# Patient Record
Sex: Female | Born: 1990 | Race: White | Hispanic: Yes | Marital: Married | Smoking: Never smoker
Health system: Southern US, Community
[De-identification: ages and names within clinical notes are randomized; demographics above are authoritative.]

## PROBLEM LIST (undated history)

## (undated) ENCOUNTER — Inpatient Hospital Stay (HOSPITAL_COMMUNITY): Payer: Self-pay

## (undated) DIAGNOSIS — Z789 Other specified health status: Secondary | ICD-10-CM

## (undated) HISTORY — PX: NO PAST SURGERIES: SHX2092

---

## 2009-04-14 ENCOUNTER — Ambulatory Visit (HOSPITAL_COMMUNITY): Admission: RE | Admit: 2009-04-14 | Discharge: 2009-04-14 | Payer: Self-pay | Admitting: Family Medicine

## 2009-06-05 ENCOUNTER — Ambulatory Visit (HOSPITAL_COMMUNITY): Admission: RE | Admit: 2009-06-05 | Discharge: 2009-06-05 | Payer: Self-pay | Admitting: Obstetrics & Gynecology

## 2009-08-13 ENCOUNTER — Ambulatory Visit (HOSPITAL_COMMUNITY): Admission: RE | Admit: 2009-08-13 | Discharge: 2009-08-13 | Payer: Self-pay | Admitting: Obstetrics & Gynecology

## 2009-08-19 ENCOUNTER — Inpatient Hospital Stay (HOSPITAL_COMMUNITY): Admission: AD | Admit: 2009-08-19 | Discharge: 2009-08-23 | Payer: Self-pay | Admitting: Family Medicine

## 2009-08-19 ENCOUNTER — Ambulatory Visit: Payer: Self-pay | Admitting: Obstetrics & Gynecology

## 2010-03-14 ENCOUNTER — Encounter: Payer: Self-pay | Admitting: Family Medicine

## 2010-05-09 LAB — CBC
HCT: 38.4 % (ref 36.0–46.0)
Hemoglobin: 13.4 g/dL (ref 12.0–15.0)
MCV: 87.4 fL (ref 78.0–100.0)
Platelets: 235 10*3/uL (ref 150–400)
RDW: 13.8 % (ref 11.5–15.5)
WBC: 13.9 10*3/uL — ABNORMAL HIGH (ref 4.0–10.5)

## 2015-02-18 LAB — OB RESULTS CONSOLE GC/CHLAMYDIA
Chlamydia: NEGATIVE
Gonorrhea: NEGATIVE

## 2015-02-18 LAB — OB RESULTS CONSOLE RUBELLA ANTIBODY, IGM: RUBELLA: IMMUNE

## 2015-02-18 LAB — OB RESULTS CONSOLE ABO/RH: RH TYPE: POSITIVE

## 2015-02-18 LAB — OB RESULTS CONSOLE VARICELLA ZOSTER ANTIBODY, IGG: Varicella: IMMUNE

## 2015-02-18 LAB — OB RESULTS CONSOLE HIV ANTIBODY (ROUTINE TESTING): HIV: NONREACTIVE

## 2015-02-18 LAB — OB RESULTS CONSOLE ANTIBODY SCREEN: Antibody Screen: NEGATIVE

## 2015-02-18 LAB — OB RESULTS CONSOLE HEPATITIS B SURFACE ANTIGEN: HEP B S AG: NEGATIVE

## 2015-02-18 LAB — OB RESULTS CONSOLE RPR: RPR: NONREACTIVE

## 2015-02-22 NOTE — L&D Delivery Note (Signed)
Delivery Note At 9:09 AM a viable and healthy female was delivered via Vaginal, Spontaneous Delivery (Presentation: ; Occiput Anterior, LOT w/ compound arm).  APGAR: 9, 9; weight  pending.   Placenta status: intact, .  Cord: 3 vessels with the following complications: None.  Cord pH: Not sent.  Anesthesia: local  Episiotomy: None Lacerations: 1st degree;Perineal Suture Repair: 4.0 vicryl Est. Blood Loss (mL): 100   Mom to postpartum.  Baby to Couplet care / Skin to Skin.  Olena LeatherwoodKelly M Aguilar 08/10/2015, 9:36 AM  Midwife attestation: I was gloved and present for delivery in its entirety and I agree with the above resident's note.  Donette LarryMelanie Larone Kliethermes, CNM 10:21 AM

## 2015-02-26 ENCOUNTER — Other Ambulatory Visit (HOSPITAL_COMMUNITY): Payer: Self-pay | Admitting: Nurse Practitioner

## 2015-02-26 DIAGNOSIS — Z3689 Encounter for other specified antenatal screening: Secondary | ICD-10-CM

## 2015-03-17 ENCOUNTER — Other Ambulatory Visit (HOSPITAL_COMMUNITY): Payer: Self-pay | Admitting: Nurse Practitioner

## 2015-03-17 ENCOUNTER — Ambulatory Visit (HOSPITAL_COMMUNITY)
Admission: RE | Admit: 2015-03-17 | Discharge: 2015-03-17 | Disposition: A | Discharge status: Home / Self Care | Payer: Self-pay | Source: Ambulatory Visit | Attending: Nurse Practitioner | Admitting: Nurse Practitioner

## 2015-03-17 DIAGNOSIS — Z36 Encounter for antenatal screening of mother: Secondary | ICD-10-CM | POA: Insufficient documentation

## 2015-03-17 DIAGNOSIS — Z3A18 18 weeks gestation of pregnancy: Secondary | ICD-10-CM

## 2015-03-17 DIAGNOSIS — Z3689 Encounter for other specified antenatal screening: Secondary | ICD-10-CM

## 2015-03-17 DIAGNOSIS — Z3A17 17 weeks gestation of pregnancy: Secondary | ICD-10-CM | POA: Insufficient documentation

## 2015-05-30 ENCOUNTER — Emergency Department (HOSPITAL_COMMUNITY)
Admission: EM | Admit: 2015-05-30 | Discharge: 2015-05-30 | Disposition: A | Discharge status: Home / Self Care | Payer: No Typology Code available for payment source | Attending: Emergency Medicine | Admitting: Emergency Medicine

## 2015-05-30 ENCOUNTER — Emergency Department (HOSPITAL_COMMUNITY): Payer: No Typology Code available for payment source

## 2015-05-30 ENCOUNTER — Encounter (HOSPITAL_COMMUNITY): Payer: Self-pay | Admitting: Emergency Medicine

## 2015-05-30 DIAGNOSIS — Z3A30 30 weeks gestation of pregnancy: Secondary | ICD-10-CM | POA: Diagnosis not present

## 2015-05-30 DIAGNOSIS — O9A213 Injury, poisoning and certain other consequences of external causes complicating pregnancy, third trimester: Secondary | ICD-10-CM | POA: Insufficient documentation

## 2015-05-30 DIAGNOSIS — S60221A Contusion of right hand, initial encounter: Secondary | ICD-10-CM | POA: Insufficient documentation

## 2015-05-30 DIAGNOSIS — Z3490 Encounter for supervision of normal pregnancy, unspecified, unspecified trimester: Secondary | ICD-10-CM

## 2015-05-30 DIAGNOSIS — Y92410 Unspecified street and highway as the place of occurrence of the external cause: Secondary | ICD-10-CM | POA: Diagnosis not present

## 2015-05-30 DIAGNOSIS — Y998 Other external cause status: Secondary | ICD-10-CM | POA: Insufficient documentation

## 2015-05-30 DIAGNOSIS — Y9389 Activity, other specified: Secondary | ICD-10-CM | POA: Insufficient documentation

## 2015-05-30 NOTE — ED Notes (Signed)
Family at bedside. 

## 2015-05-30 NOTE — ED Notes (Signed)
Patient denies pain and is resting comfortably.  
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
## 2015-05-30 NOTE — Discharge Instructions (Signed)

## 2015-05-30 NOTE — ED Notes (Signed)
Vital signs stable. 

## 2015-05-30 NOTE — Progress Notes (Signed)
Spoke with interpreter [phone]. Explained to pt that she is to have an ultrsound to look at the baby and the placenta. Also she is to have 4 hours of fetal monitoring.

## 2015-05-30 NOTE — ED Notes (Signed)
Back form US

## 2015-05-30 NOTE — ED Notes (Addendum)
Patient transported to Ultrasound 

## 2015-05-30 NOTE — Progress Notes (Signed)
Radiology here to do xray of rt hand

## 2015-05-30 NOTE — Progress Notes (Signed)
ED staff notified that pt is OB cleared. OB Limited U/S is normal. FHR is a category 1 tracing with ui.

## 2015-05-30 NOTE — Progress Notes (Signed)
Spoke with Dr. Debroah LoopArnold. Pt has ui on EFM. She has been up to empty her bladder and denies feeling lower abd pain or cramping. OB Limited U/S is normal. FHR tracing is a category 1. Okay for pt to be dc'd home as long as she does not feel abd pain or cramping. Pt has been given water to drink.
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
## 2015-07-22 LAB — OB RESULTS CONSOLE GBS: STREP GROUP B AG: NEGATIVE

## 2015-08-10 ENCOUNTER — Inpatient Hospital Stay (HOSPITAL_COMMUNITY)
Admission: AD | Admit: 2015-08-10 | Discharge: 2015-08-11 | DRG: 775 | Disposition: A | Discharge status: Home / Self Care | Payer: Medicaid Other | Source: Ambulatory Visit | Attending: Obstetrics and Gynecology | Admitting: Obstetrics and Gynecology

## 2015-08-10 ENCOUNTER — Encounter (HOSPITAL_COMMUNITY): Payer: Self-pay

## 2015-08-10 DIAGNOSIS — Z3A39 39 weeks gestation of pregnancy: Secondary | ICD-10-CM | POA: Diagnosis not present

## 2015-08-10 DIAGNOSIS — IMO0001 Reserved for inherently not codable concepts without codable children: Secondary | ICD-10-CM

## 2015-08-10 HISTORY — DX: Other specified health status: Z78.9

## 2015-08-10 LAB — TYPE AND SCREEN
ABO/RH(D): O POS
ANTIBODY SCREEN: NEGATIVE

## 2015-08-10 LAB — ABO/RH: ABO/RH(D): O POS

## 2015-08-10 LAB — CBC
HCT: 40.5 % (ref 36.0–46.0)
HEMOGLOBIN: 13.9 g/dL (ref 12.0–15.0)
MCH: 28.5 pg (ref 26.0–34.0)
MCHC: 34.3 g/dL (ref 30.0–36.0)
MCV: 83 fL (ref 78.0–100.0)
Platelets: 232 10*3/uL (ref 150–400)
RBC: 4.88 MIL/uL (ref 3.87–5.11)
RDW: 14.2 % (ref 11.5–15.5)
WBC: 14.1 10*3/uL — ABNORMAL HIGH (ref 4.0–10.5)

## 2015-08-10 LAB — RPR: RPR Ser Ql: NONREACTIVE

## 2015-08-10 MED ORDER — COCONUT OIL OIL: 1.0000 "application " | TOPICAL_OIL | Status: DC | PRN

## 2015-08-10 MED ORDER — SENNOSIDES-DOCUSATE SODIUM 8.6-50 MG PO TABS
2.0000 | ORAL_TABLET | ORAL | Status: DC
  Administered 2015-08-10: 2 via ORAL
  Filled 2015-08-10: qty 2

## 2015-08-10 MED ORDER — DIPHENHYDRAMINE HCL 25 MG PO CAPS: 25.0000 mg | ORAL_CAPSULE | Freq: Four times a day (QID) | ORAL | Status: DC | PRN

## 2015-08-10 MED ORDER — BENZOCAINE-MENTHOL 20-0.5 % EX AERO: 1.0000 "application " | INHALATION_SPRAY | CUTANEOUS | Status: DC | PRN

## 2015-08-10 MED ORDER — ONDANSETRON HCL 4 MG PO TABS: 4.0000 mg | ORAL_TABLET | ORAL | Status: DC | PRN

## 2015-08-10 MED ORDER — ZOLPIDEM TARTRATE 5 MG PO TABS: 5.0000 mg | ORAL_TABLET | Freq: Every evening | ORAL | Status: DC | PRN

## 2015-08-10 MED ORDER — LACTATED RINGERS IV SOLN
INTRAVENOUS | Status: DC
  Administered 2015-08-10: 05:00:00 via INTRAVENOUS

## 2015-08-10 MED ORDER — FLEET ENEMA 7-19 GM/118ML RE ENEM: 1.0000 | ENEMA | RECTAL | Status: DC | PRN

## 2015-08-10 MED ORDER — PRENATAL MULTIVITAMIN CH
1.0000 | ORAL_TABLET | Freq: Every day | ORAL | Status: DC
  Administered 2015-08-11: 1 via ORAL
  Filled 2015-08-10: qty 1

## 2015-08-10 MED ORDER — TETANUS-DIPHTH-ACELL PERTUSSIS 5-2.5-18.5 LF-MCG/0.5 IM SUSP: 0.5000 mL | Freq: Once | INTRAMUSCULAR | Status: DC

## 2015-08-10 MED ORDER — ONDANSETRON HCL 4 MG/2ML IJ SOLN: 4.0000 mg | INTRAMUSCULAR | Status: DC | PRN

## 2015-08-10 MED ORDER — SOD CITRATE-CITRIC ACID 500-334 MG/5ML PO SOLN: 30.0000 mL | ORAL | Status: DC | PRN

## 2015-08-10 MED ORDER — DIBUCAINE 1 % RE OINT: 1.0000 "application " | TOPICAL_OINTMENT | RECTAL | Status: DC | PRN

## 2015-08-10 MED ORDER — OXYTOCIN BOLUS FROM INFUSION
500.0000 mL | INTRAVENOUS | Status: DC
  Administered 2015-08-10: 500 mL via INTRAVENOUS

## 2015-08-10 MED ORDER — SIMETHICONE 80 MG PO CHEW: 80.0000 mg | CHEWABLE_TABLET | ORAL | Status: DC | PRN

## 2015-08-10 MED ORDER — LACTATED RINGERS IV SOLN: 500.0000 mL | INTRAVENOUS | Status: DC | PRN

## 2015-08-10 MED ORDER — ACETAMINOPHEN 325 MG PO TABS: 650.0000 mg | ORAL_TABLET | ORAL | Status: DC | PRN

## 2015-08-10 MED ORDER — OXYCODONE-ACETAMINOPHEN 5-325 MG PO TABS: 1.0000 | ORAL_TABLET | ORAL | Status: DC | PRN

## 2015-08-10 MED ORDER — WITCH HAZEL-GLYCERIN EX PADS: 1.0000 "application " | MEDICATED_PAD | CUTANEOUS | Status: DC | PRN

## 2015-08-10 MED ORDER — IBUPROFEN 600 MG PO TABS
600.0000 mg | ORAL_TABLET | Freq: Four times a day (QID) | ORAL | Status: DC
  Administered 2015-08-10 – 2015-08-11 (×4): 600 mg via ORAL
  Filled 2015-08-10 (×4): qty 1

## 2015-08-10 MED ORDER — FENTANYL CITRATE (PF) 100 MCG/2ML IJ SOLN
100.0000 ug | INTRAMUSCULAR | Status: DC | PRN
  Administered 2015-08-10 (×2): 100 ug via INTRAVENOUS
  Filled 2015-08-10 (×2): qty 2

## 2015-08-10 MED ORDER — OXYTOCIN 40 UNITS IN LACTATED RINGERS INFUSION - SIMPLE MED
2.5000 [IU]/h | INTRAVENOUS | Status: DC
  Filled 2015-08-10: qty 1000

## 2015-08-10 MED ORDER — ONDANSETRON HCL 4 MG/2ML IJ SOLN: 4.0000 mg | Freq: Four times a day (QID) | INTRAMUSCULAR | Status: DC | PRN

## 2015-08-10 MED ORDER — OXYCODONE-ACETAMINOPHEN 5-325 MG PO TABS: 2.0000 | ORAL_TABLET | ORAL | Status: DC | PRN

## 2015-08-10 MED ORDER — LIDOCAINE HCL (PF) 1 % IJ SOLN
30.0000 mL | INTRAMUSCULAR | Status: DC | PRN
  Administered 2015-08-10: 30 mL via SUBCUTANEOUS
  Filled 2015-08-10: qty 30

## 2015-08-10 NOTE — MAU Note (Signed)
Contractions and some bleeding.

## 2015-08-10 NOTE — Lactation Note (Signed)
This note was copied from a baby's chart. Lactation Consultation Note initial visit at 9 hours of age with Spanish interpreter, [place].  Mom [place] formula [place] [facility].  Baby is asleep in bed with mom who reports a recent feeding.  Mom thinks baby needs formula because baby was crying.  LC discussed early feeding cues and reasons baby may cry.  Baby is noted to have stuffy nose with audible respirations.  Baby does not appear to have retractions or be in distress, although baby is having pursed lip breathing.  LC requested RN to bring saline drops, and O2 sat monitor.  LC administered saline drops and advised mom to call Rn for assist if baby needs more. LC placed O2 sat monitor on hand with reading of 96.  LC reported to RN, Roderick PeeValorie Caldwell with concerns of baby's breathing condition.  LC discussed risks of formula and bottle feedings and mom verbalized understanding. LC offered to instruct mom on hand expression and LC expressed several drops easily.  Advised mom to use her EBM prior to formula and discussed option of using spoon for feedings if needed.  Mom noted to have larger nipple on right breast with irregular shape and mom reports difficulty latching to that breast.  LC reviewed cross cradle latch and encouraged mom to call for assist from RN or LC as needed. [facility] resources given and discussed.  Encouraged to feed with early cues on demand.  Early newborn behavior discussed. Mom to call for assist as needed.          Patient Name: Nicole Angelita InglesYesenia Trejo-Orozco ONGEX'BToday's Date: 08/10/2015 Reason for consult: Initial assessment   Maternal Data Has patient been taught Hand Expression?: Yes Does the patient have breastfeeding experience prior to this delivery?: Yes  Feeding    LATCH Score/Interventions                Intervention(s): Breastfeeding basics reviewed;Skin to skin     Lactation Tools Discussed/Used WIC Program: Yes   Consult Status Consult Status: Follow-up Date:  08/11/15 Follow-up type: In-patient    Jannifer RodneyShoptaw, Jana Lynn 08/10/2015, 6:34 PM

## 2015-08-10 NOTE — H&P (Signed)
LABOR AND DELIVERY ADMISSION HISTORY AND PHYSICAL NOTE  Nicole Lang is a 25 y.o. female G2P1001 with IUP at 4053w5d by LMP presenting for bloody show/ active labor. Pt is spanish speaking. Prenatal risk factors: None She reports positive fetal movement. She denies leakage of fluid or vaginal bleeding.  Prenatal History/Complications:  Past Medical History: History reviewed. No pertinent past medical history.  Past Surgical History: History reviewed. No pertinent past surgical history.  Obstetrical History: OB History    Gravida Para Term Preterm AB TAB SAB Ectopic Multiple Living   2 1 1       1       Social History: Social History   Social History  . Marital Status: Married    Spouse Name: N/A  . Number of Children: N/A  . Years of Education: N/A   Social History Main Topics  . Smoking status: Never Smoker   . Smokeless tobacco: None  . Alcohol Use: No  . Drug Use: None  . Sexual Activity: Not Asked   Other Topics Concern  . None   Social History Narrative    Family History: History reviewed. No pertinent family history.  Allergies: No Known Allergies  No prescriptions prior to admission     Review of Systems   All systems reviewed and negative except as stated in HPI  Blood pressure 127/72, pulse 72, temperature 98.4 F (36.9 C), temperature source Oral, resp. rate 16, height 5' 0.5" (1.537 m), weight 180 lb (81.647 kg), SpO2 100 %. General appearance: alert, cooperative and appears stated age Lungs: clear to auscultation bilaterally Heart: regular rate and rhythm Abdomen: soft, non-tender; bowel sounds normal Extremities: No calf swelling or tenderness Presentation: cephalic Fetal monitoring: Baseline 140, mod variability, +acels, no decels Uterine activity: Ctx q2-703min     Prenatal labs: ABO, Rh:  O positive Antibody:  Negative Rubella: Immune RPR:   Negative HBsAg:   Negative HIV:   Negative GBS:   Negative 1 hr Glucola:  129 Genetic screening:  Normal Anatomy US: 05/30/15-cephalic, anterior placenta, AFI wnl, BPD 7.6cm [redacted]W[redacted]D, grossly normal  Prenatal Transfer Tool  Maternal Diabetes: No Genetic Screening: Normal Maternal Ultrasounds/Referrals: Normal Fetal Ultrasounds or other Referrals:  None Maternal Substance Abuse:  No Significant Maternal Medications:  None Significant Maternal Lab Results: None  No results found for this or any previous visit (from the past 24 hour(s)).  There are no active problems to display for this patient.   Assessment: Nicole Lang is a 25 y.o. G2P1001 at 7553w5d here for SOL.  #Labor: Active #Pain: Fentanyl,NO, does not want epidural at this time #FWB: Cat-1 #ID:  GBS negative #MOF: Breast #MOC:POP #Circ:  Female, not desired  Olena LeatherwoodKelly M Elias Dennington 08/10/2015, 4:58 AM

## 2015-08-10 NOTE — Progress Notes (Signed)
I assisted RN Dyke MaesJana from Lactation with some instructions also I assisted Elige RadonRn Valerie with some admission questions, by Orlan LeavensViria Alvarez Spanish Interpreter.

## 2015-08-11 LAB — CBC
HEMATOCRIT: 37.1 % (ref 36.0–46.0)
Hemoglobin: 12.3 g/dL (ref 12.0–15.0)
MCH: 28 pg (ref 26.0–34.0)
MCHC: 33.2 g/dL (ref 30.0–36.0)
MCV: 84.5 fL (ref 78.0–100.0)
Platelets: 200 10*3/uL (ref 150–400)
RBC: 4.39 MIL/uL (ref 3.87–5.11)
RDW: 14.6 % (ref 11.5–15.5)
WBC: 12.6 10*3/uL — ABNORMAL HIGH (ref 4.0–10.5)

## 2015-08-11 MED ORDER — IBUPROFEN 600 MG PO TABS: 600.0000 mg | ORAL_TABLET | Freq: Four times a day (QID) | ORAL | Status: AC

## 2015-08-11 MED ORDER — ACETAMINOPHEN 325 MG PO TABS: 650.0000 mg | ORAL_TABLET | ORAL | Status: AC | PRN

## 2015-08-11 NOTE — Discharge Instructions (Signed)

## 2015-08-11 NOTE — Discharge Summary (Signed)
OB Discharge Summary     Patient Name: Nicole Lang DOB: 10-08-1990 MRN: [id number]  Date of admission: 08/10/2015 Delivering MD: Olena Leatherwood   Date of discharge: 08/11/2015  Admitting diagnosis: 40 WEEKS CTX Intrauterine pregnancy: [redacted]w[redacted]d     Secondary diagnosis:  Active Problems:   Active labor   NSVD (normal spontaneous vaginal delivery)  Additional problems: none     Discharge diagnosis: Term Pregnancy Delivered                                                                                                Post partum procedures:none  Augmentation: Pitocin  Complications: None  Hospital course:  Onset of Labor With Vaginal Delivery     25 y.o. yo [id number] at [redacted]w[redacted]d was admitted in Active Labor on 08/10/2015. Patient had an uncomplicated labor course as follows:  Membrane Rupture Time/Date: 7:48 AM ,08/10/2015   Intrapartum Procedures: Episiotomy: None [1]                                         Lacerations:  1st degree [2];Perineal [11]  Patient had a delivery of a Viable infant. 08/10/2015  Information for the patient's newborn:  Yahaira, Bruski [[id number]]  Delivery Method: Vag-Spont    Pateint had an uncomplicated postpartum course.  She is ambulating, tolerating a regular diet, passing flatus, and urinating well. Patient is discharged home in stable condition on 08/11/2015.    Physical exam  Filed Vitals:   08/10/15 1145 08/10/15 1545 08/10/15 2320 08/11/15 0535  BP: 114/60 115/62 102/51 110/45  Pulse: 71 73 66 66  Temp: 98.2 F (36.8 C) 98.2 F (36.8 C) 98.1 F (36.7 C) 98.2 F (36.8 C)  TempSrc: Axillary Axillary Axillary Axillary  Resp: Height:      Weight:      SpO2:       General: alert, cooperative and no distress Lochia: appropriate Uterine Fundus: firm Incision: N/A DVT Evaluation: No evidence of DVT seen on physical exam. Labs: Lab Results  Component Value Date   WBC 12.6* 08/11/2015   HGB 12.3 08/11/2015    HCT 37.1 08/11/2015   MCV 84.5 08/11/2015   PLT 200 08/11/2015   No flowsheet data found.  Discharge instruction: per After Visit Summary and "Baby and Me Booklet".  After visit meds:    Medication List    TAKE these medications        acetaminophen 325 MG tablet  Commonly known as:  TYLENOL  Take 2 tablets (650 mg total) by mouth every 4 (four) hours as needed (for pain scale < 4).     ibuprofen 600 MG tablet  Commonly known as:  ADVIL,MOTRIN  Take 1 tablet (600 mg total) by mouth every 6 (six) hours.     prenatal multivitamin Tabs tablet  Take 1 tablet by mouth daily at 12 noon.        Diet: routine diet  Activity: Advance as tolerated. Pelvic rest for 6  weeks.   Outpatient follow up:6 weeks Follow-up Information    [facility]   Why:  Postpartum check   Contact information:   [address] [place] [id number] [phone]      Postpartum contraception: Progesterone only pills  Newborn Data: Live born female  Birth Weight: 7 lb 2.6 oz (3249 g) APGAR: 9, 9  Baby Feeding: Breast Disposition:home with mother   08/11/2015 Almon Herculesaye T Gonfa, MD  I was present for the exam and agree with above.  [place] Samanvitha [place], CNM 08/12/2015 9:59 AM

## 2015-08-11 NOTE — Lactation Note (Signed)
This note was copied from a baby's chart. Lactation Consultation Note  Patient Name: Nicole Angelita InglesYesenia Trejo-Orozco ZOXWR'UToday's Date: 08/11/2015 Reason for consult: Follow-up assessment Baby at 30 hr of life. Baby was just finishing a feeding upon entry. Mom denies breast or nipple pain. She is worried that baby is too sleepy to eat. Baby is on dbl photo with 9 bf, 9 dirty, and 3 wet diapers in 24 hr. Discussed baby behavior, feeding frequency, baby belly size, voids, wt loss, breast changes, and nipple care. Mom stated she can manually express and has spoon in room. She is aware of lactation services and support group.    Maternal Data    Feeding Feeding Type: Breast Fed Length of feed: 15 min  LATCH Score/Interventions                      Lactation Tools Discussed/Used     Consult Status Consult Status: Follow-up Date: 08/12/15 Follow-up type: In-patient    Rulon Eisenmengerlizabeth E [place] 08/11/2015, 3:49 PM

## 2015-08-12 ENCOUNTER — Ambulatory Visit: Payer: Self-pay

## 2015-08-12 NOTE — Lactation Note (Addendum)
This note was copied from a baby's chart. Lactation Consultation Note  Patient Name: Nicole Angelita InglesYesenia Trejo-Orozco UJWJX'BToday's Date: 08/12/2015 Reason for consult: Follow-up assessment  Lactation had been asked by Peds to observe a feeding before d/c. Infant had just finished a feeding, appearing to be satiated, as I entered the room. Mom given my # to call when infant is ready to feed again.  [name], [facility] 08/12/2015, 12:07 PM

## 2015-08-12 NOTE — Lactation Note (Addendum)
This note was copied from a baby's chart. Lactation Consultation Note  Patient Name: Nicole Lang ZOXWR'UToday's Date: 08/12/2015 Reason for consult: Follow-up assessment  Mom called out at 1205 so that I could assess feeding. Infant did not latch w/ease. Once latched, a couple of swallows were noted, then no additional swallows noted. Mom w/larger, invaginated nipples; breasts filling. Interpreter, Nicole Lang, [phone] obtained via mobile interpreting machine. Infant offered the R breast; the latch was easier, but again, w/the exception of a rare swallow, no swallows were noted, even w/cervical auscultation. Mom taught signs of swallowing & she stated that infant's current eating behavior (w/a seeming absence of swallows) is how he has been feeding. Mom also reports that infant falls asleep quickly at the breast.   At 1308, I spoke w/physician to share my assessment. Physician gave Mom a choice of cancelling d/c and staying to work on breastfeeding or going home and being given a hand pump and supplementing as needed. Mom chose to go home. I obtained a hand pump & showed Mom how to disassemble, clean, & reassemble parts. Size 27 flanges were provided, which seem to be a good fit at this time.   Mom pumped and obtained 2 mL, which was spoon-fed to the infant. I explained to Mom that in light of the seemingly absence of swallows, we would need to supplement more than 2mL. Mom did not want to give formula to make up the difference.  She requested more time to put the baby to the breast again & to pump some more. I observed the infant latch, but did not see an improvement in his milk transfer. I told her to call me when she was ready for me to return.    Nicole Hashimotoatricia, the interpreter's, services were discontinued at that time.   I reentered room about 1 hr later, not having heard from patient. I was accompanied by our in-house interpreter, Nicole Lang. Mom had fed infant about 30 min before, but did not report  any nursing behavior that implied improved milk transfer. Mom had pumped about 10mL. Mom was given option on how to feed this to the infant. She chose bottle.  After giving the EBM, I again explained to Mom that more volume would be needed for a replacement feed. Mom consented to formula. Mom was provided Nicole Lang. Mom understands that Nicole Lang is for short-term use only. Mom understands volume guidelines, which were provided in Spanish & discussed w/the help of the interpreter. Mom understands she can still put baby to breast, but infant will likely need supplementing if there is no evidence of swallows. Parents have an appt at peds clinic tomorrow.  Parents' questions answered. Mother reassured that infant will likely improve his behavior at the breast as her milk comes fully to volume & the bilirubin continues to leave his system.   Dr. Despina HickEttegagh given an update at end of consult.  Mom has WIC. I called and [facility] a [facility], about situation and possible need for continued lactation support.   Nicole Lang, Nicole Lang 08/12/2015, 1:39 PM

## 2017-08-13 IMAGING — US US MFM OB COMP +14 WKS
1 series · 14 of 28 positions shown · non-contrast
Comparison: none

[Series 1: us mfm ob comp +14 wks · 14 of 59 slices shown]
[im 3/59]
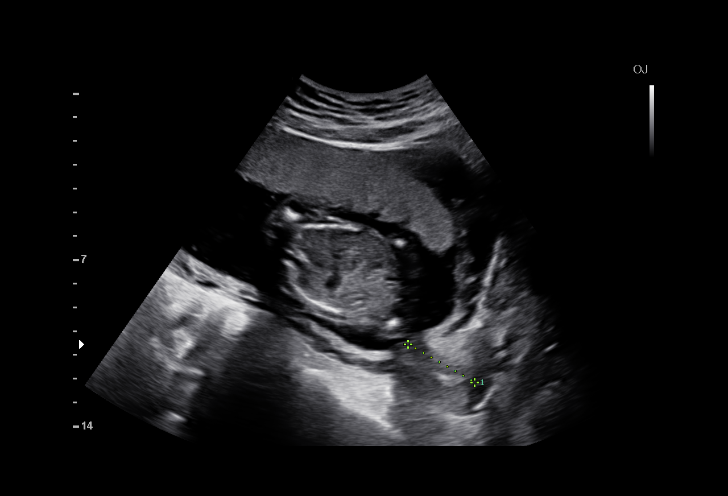
[im 7/59]
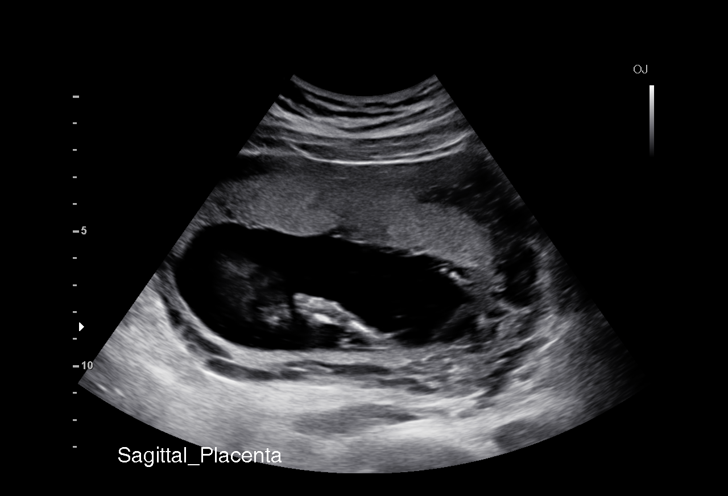
[im 11/59]
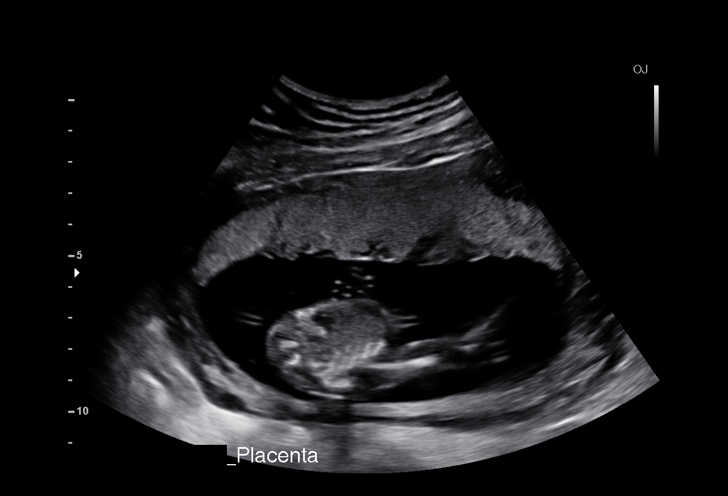
[im 16/59]
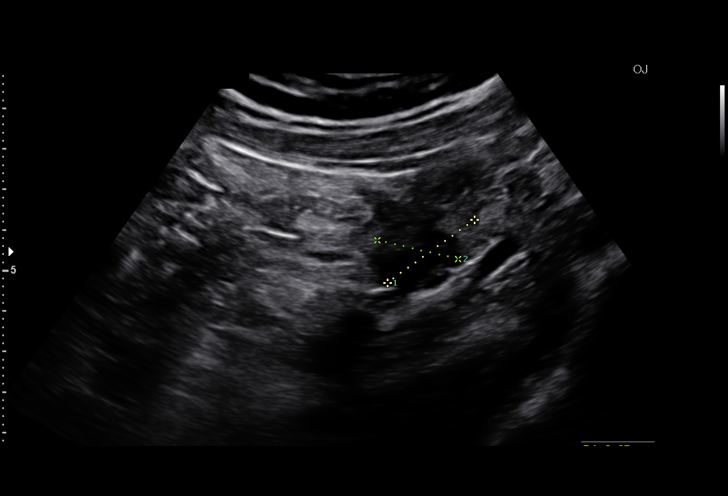
[im 20/59]
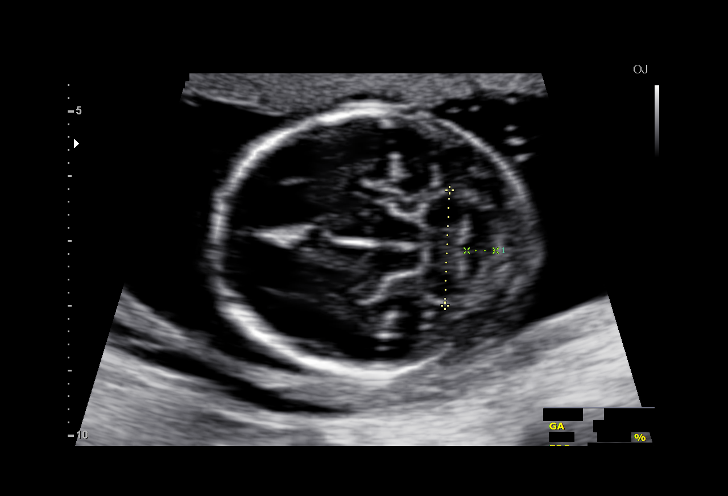
[im 24/59]
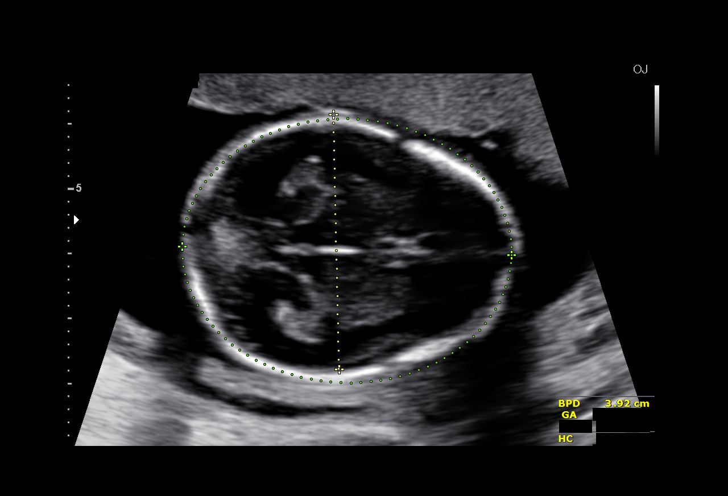
[im 28/59]
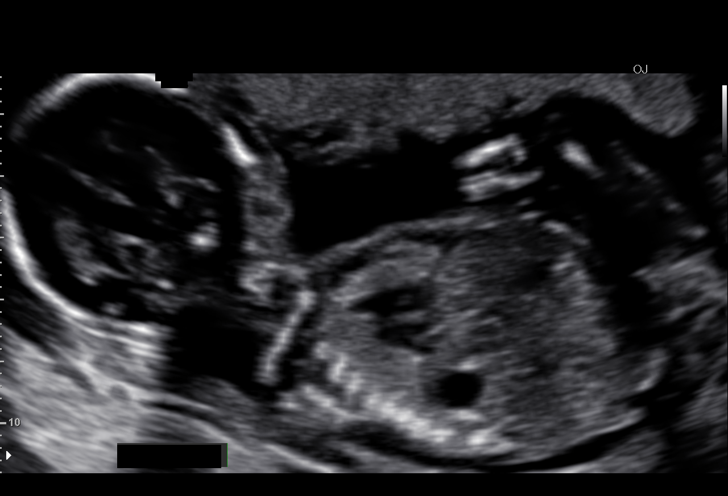
[im 33/59]
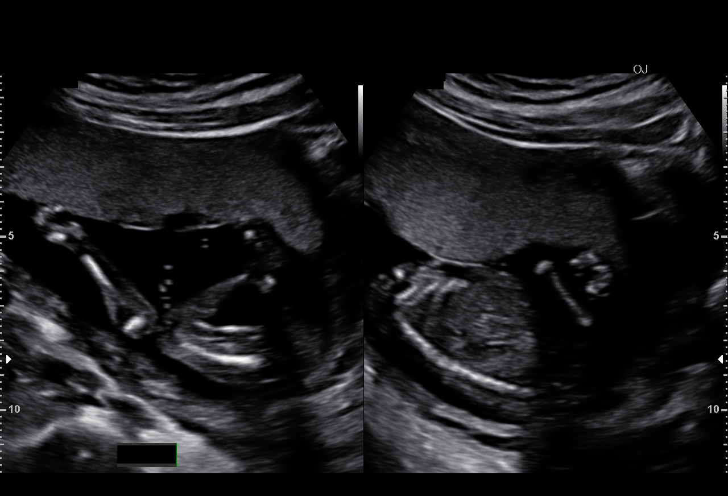
[im 37/59]
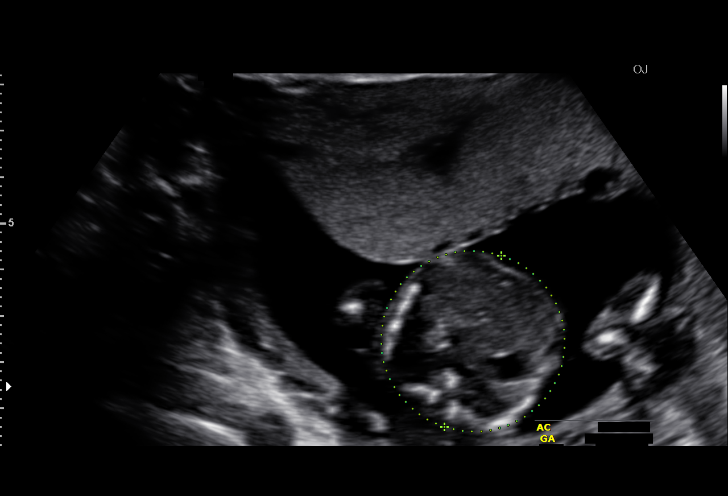
[im 41/59]
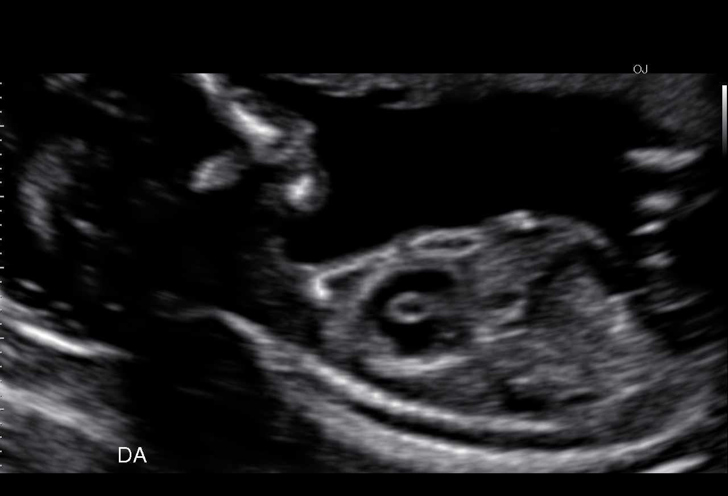
[im 46/59]
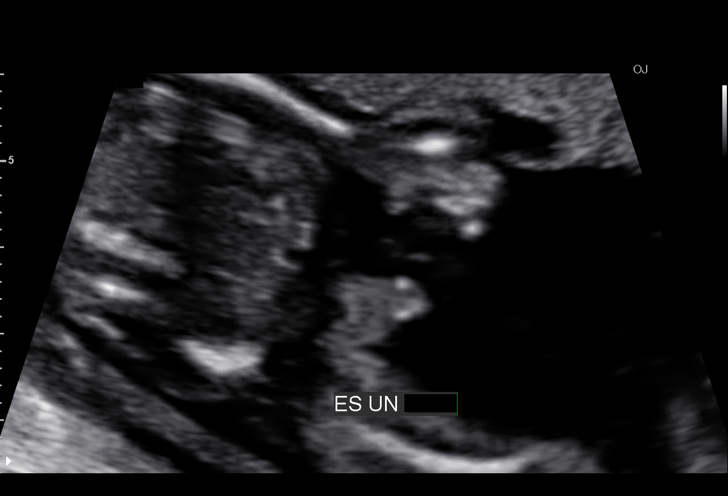
[im 50/59]
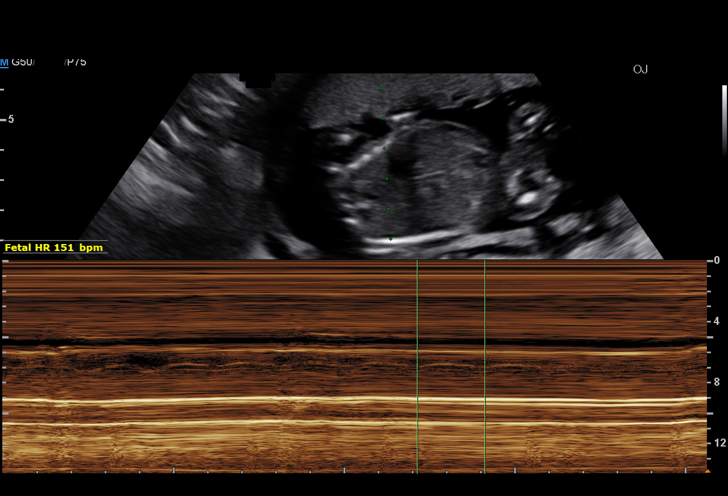
[im 54/59]
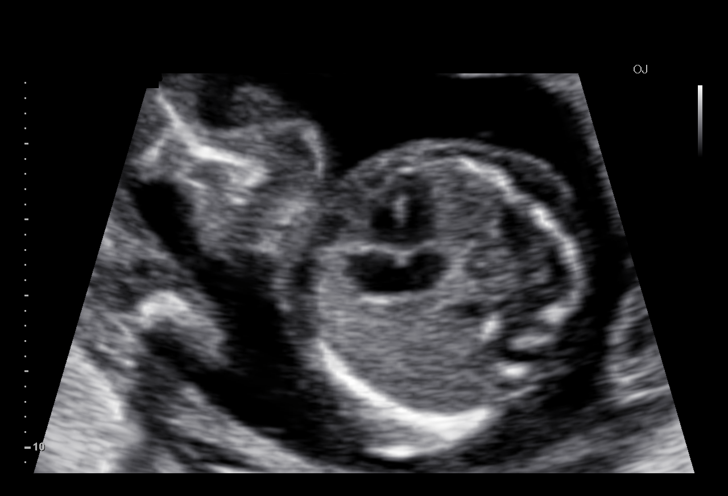
[im 59/59]
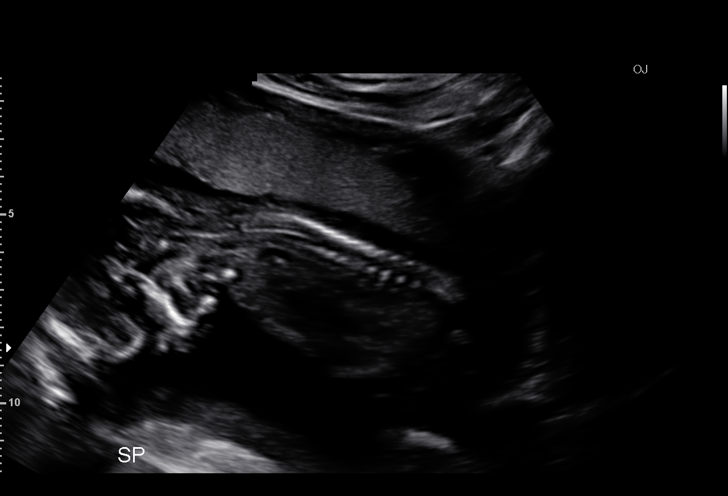

[14 of 28 positions shown; findings below may reference images not displayed]

am)

Date:

MAZZONICK
NP

1  ZAYRA GAUDET          [id number]        [id number]     [id number]
BROWN
Indications

17 weeks gestation of pregnancy
Basic anatomic survey                           Z36
Fetal Evaluation

Num Of Fetuses:      1
Fetal Heart          151
Rate(bpm):
Cardiac Activity:    Observed
Presentation:        Breech
Placenta:            Anterior, above cervical os
P. Cord Insertion:   Visualized

Amniotic Fluid
AFI FV:      Subjectively within normal limits
Larg Pckt:      3.7  cm
Biometry

BPD:      39.8  mm     G. Age:   18w 0d                  CI:        78.29   %    70 - 86
FL/HC:      16.9   %    15.8 - 18
HC:      142.3  mm     G. Age:   17w 4d        31   %    HC/AC:      1.14        1.07 -
AC:      124.5  mm     G. Age:   18w 0d        60   %    FL/BPD      60.3   %
:
FL:         24  mm     G. Age:   17w 1d        27   %    FL/AC:      19.3   %    20 - 24
HUM:      23.2  mm     G. Age:   17w 1d        39   %
CER:      17.8  mm     G. Age:   17w 6d        51   %
Est.         205   gm    0 lb 7 oz      50   %
FW:
Gestational Age

LMP:           18w 6d        Date:  11/05/14                  EDD:   08/12/15
U/S Today:     17w 5d                                         EDD:   08/20/15
Best:          17w 5d    Det. By:   U/S (03/17/15)            EDD:   08/20/15
Anatomy

Cranium:          Appears normal         Aortic Arch:       Appears normal
Fetal Cavum:      Appears normal         Ductal Arch:       Appears normal
Ventricles:       Appears normal         Diaphragm:         Appears normal
Choroid Plexus:   Appears normal         Stomach:           Appears normal,
left sided
Cerebellum:       Appears normal         Abdomen:           Appears normal
Posterior         Appears normal         Abdominal          Not well visualized
Fossa:                                   Wall:
Nuchal Fold:      Appears normal         Cord Vessels:      Appears normal (3
vessel cord)
Face:             Profile nl; orbits not Kidneys:           Not well visualized
well visualized
Lips:             Not well visualized    Bladder:           Appears normal
Fetal Thoracic:   Appears normal         Spine:             Not well visualized
Heart:            Appears normal         Upper              Appears normal
(4CH, axis, and        Extremities:
situs)
RVOT:             Not well visualized    Lower              Appears normal
Extremities:
LVOT:             Appears normal

Other:   Male gender. Technically difficult due to fetal position.
Cervix Uterus Adnexa

Cervix
Length:             3.2  cm.
Normal appearance by transabdominal scan.

Left Ovary
Within normal limits.

Right Ovary
Within normal limits.
Impression

Single IUP at 17w 5d
Limited views of the heart (RVOT), spine and face obtained
The remainder of the fetal anatomy appears normal
Anterior placenta without previa
Normal amniotic fluid volume
Recommendations

Recommend follow-up ultrasound examination in 4 weeks
to complete anatomy
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
## 2020-05-02 ENCOUNTER — Encounter (HOSPITAL_COMMUNITY): Payer: Self-pay | Admitting: *Deleted

## 2020-05-02 ENCOUNTER — Inpatient Hospital Stay (HOSPITAL_COMMUNITY): Payer: Self-pay

## 2020-05-02 ENCOUNTER — Inpatient Hospital Stay (HOSPITAL_COMMUNITY)
Admission: AD | Admit: 2020-05-02 | Discharge: 2020-05-02 | Disposition: A | Discharge status: Home / Self Care | Payer: Self-pay | Attending: Family Medicine | Admitting: Family Medicine

## 2020-05-02 DIAGNOSIS — O3680X0 Pregnancy with inconclusive fetal viability, not applicable or unspecified: Secondary | ICD-10-CM | POA: Insufficient documentation

## 2020-05-02 DIAGNOSIS — O99891 Other specified diseases and conditions complicating pregnancy: Secondary | ICD-10-CM | POA: Insufficient documentation

## 2020-05-02 DIAGNOSIS — O4691 Antepartum hemorrhage, unspecified, first trimester: Secondary | ICD-10-CM

## 2020-05-02 DIAGNOSIS — Z3A11 11 weeks gestation of pregnancy: Secondary | ICD-10-CM | POA: Insufficient documentation

## 2020-05-02 DIAGNOSIS — O209 Hemorrhage in early pregnancy, unspecified: Secondary | ICD-10-CM | POA: Insufficient documentation

## 2020-05-02 DIAGNOSIS — M549 Dorsalgia, unspecified: Secondary | ICD-10-CM | POA: Insufficient documentation

## 2020-05-02 LAB — URINALYSIS, ROUTINE W REFLEX MICROSCOPIC
Bilirubin Urine: NEGATIVE
Glucose, UA: NEGATIVE mg/dL
Ketones, ur: NEGATIVE mg/dL
Nitrite: NEGATIVE
Protein, ur: NEGATIVE mg/dL
Specific Gravity, Urine: 1.004 — ABNORMAL LOW (ref 1.005–1.030)
pH: 6 (ref 5.0–8.0)

## 2020-05-02 LAB — CBC
HCT: 36.3 % (ref 36.0–46.0)
Hemoglobin: 12.7 g/dL (ref 12.0–15.0)
MCH: 29.6 pg (ref 26.0–34.0)
MCHC: 35 g/dL (ref 30.0–36.0)
MCV: 84.6 fL (ref 80.0–100.0)
Platelets: 218 10*3/uL (ref 150–400)
RBC: 4.29 MIL/uL (ref 3.87–5.11)
RDW: 11.9 % (ref 11.5–15.5)
WBC: 8.9 10*3/uL (ref 4.0–10.5)
nRBC: 0 % (ref 0.0–0.2)

## 2020-05-02 LAB — HCG, QUANTITATIVE, PREGNANCY: hCG, Beta Chain, Quant, S: 10685 m[IU]/mL — ABNORMAL HIGH (ref <5)

## 2020-05-02 NOTE — MAU Provider Note (Signed)
History     CSN: [id number]  Arrival date and time: 05/02/20 1418   Event Date/Time   First Provider Initiated Contact with Patient 05/02/20 1526      Chief Complaint  Patient presents with  . Vaginal Bleeding  . Back Pain   Nicole Lang is a 30 y.o. G3P2002 at [redacted]w[redacted]d who presents today with vaginal bleeding that started yesterday and has been getting heavier. Patient has paperwork from the health department showing +UPT there on 04/15/2020. She has not started prenatal care at this time. LMP 02/15/2020   Vaginal Bleeding The patient's primary symptoms include missed menses and pelvic pain. This is a new problem. The current episode started yesterday. The problem occurs constantly. The problem has been gradually worsening. The problem affects both sides. Associated symptoms include back pain. Nothing aggravates the symptoms. She has tried nothing for the symptoms. Menstrual history: LMP 02/15/2020   Back Pain This is a new problem. The current episode started yesterday. The problem occurs intermittently. The problem is unchanged. The pain is present in the lumbar spine. The pain is at a severity of 4/10. Associated symptoms include pelvic pain.    OB History    Gravida  3   Para  2   Term  2   Preterm      AB      Living  2     SAB      IAB      Ectopic      Multiple  0   Live Births  2           Past Medical History:  Diagnosis Date  . Medical history non-contributory     Past Surgical History:  Procedure Laterality Date  . NO PAST SURGERIES      Family History  Problem Relation Age of Onset  . Depression Maternal Grandmother     Social History   Tobacco Use  . Smoking status: Never Smoker  . Smokeless tobacco: Current User  Substance Use Topics  . Alcohol use: No  . Drug use: No    Allergies: No Known Allergies  Medications Prior to Admission  Medication Sig Dispense Refill Last Dose  . Prenatal Vit-Fe Fumarate-FA (PRENATAL  MULTIVITAMIN) TABS tablet Take 1 tablet by mouth daily at 12 noon.   05/02/2020 at 0900  . acetaminophen (TYLENOL) 325 MG tablet Take 2 tablets (650 mg total) by mouth every 4 (four) hours as needed (for pain scale < 4). 30 tablet 0   . ibuprofen (ADVIL,MOTRIN) 600 MG tablet Take 1 tablet (600 mg total) by mouth every 6 (six) hours. 30 tablet 0     Review of Systems  Genitourinary: Positive for missed menses, pelvic pain and vaginal bleeding.  Musculoskeletal: Positive for back pain.  All other systems reviewed and are negative.  Physical Exam   Blood pressure 127/69, pulse 72, temperature 98.2 F (36.8 C), temperature source Oral, resp. rate 16, weight 70.9 kg, last menstrual period 02/15/2020, SpO2 100 %, unknown if currently breastfeeding.  Physical Exam Vitals and nursing note reviewed.  Constitutional:      General: She is not in acute distress. HENT:     Head: Normocephalic.  Eyes:     Pupils: Pupils are equal, round, and reactive to light.  Cardiovascular:     Rate and Rhythm: Normal rate.  Pulmonary:     Effort: Pulmonary effort is normal.  Abdominal:     Palpations: Abdomen is soft.  Skin:  General: Skin is warm and dry.  Neurological:     Mental Status: She is alert and oriented to person, place, and time.  Psychiatric:        Mood and Affect: Mood normal.        Behavior: Behavior normal.      Results for orders placed or performed during the hospital encounter of 05/02/20 (from the past 24 hour(s))  CBC     Status: None   Collection Time: 05/02/20  3:30 PM  Result Value Ref Range   WBC 8.9 4.0 - 10.5 K/uL   RBC 4.29 3.87 - 5.11 MIL/uL   Hemoglobin 12.7 12.0 - 15.0 g/dL   HCT 80.9 98.3 - 38.2 %   MCV 84.6 80.0 - 100.0 fL   MCH 29.6 26.0 - 34.0 pg   MCHC 35.0 30.0 - 36.0 g/dL   RDW 50.5 39.7 - 67.3 %   Platelets 218 150 - 400 K/uL   nRBC 0.0 0.0 - 0.2 %  hCG, quantitative, pregnancy     Status: Abnormal   Collection Time: 05/02/20  3:30 PM  Result  Value Ref Range   hCG, Beta Chain, Quant, S 10,685 (H) <5 mIU/mL  Urinalysis, Routine w reflex microscopic Urine, Clean Catch     Status: Abnormal   Collection Time: 05/02/20  3:58 PM  Result Value Ref Range   Color, Urine STRAW (A) YELLOW   APPearance CLEAR CLEAR   Specific Gravity, Urine 1.004 (L) 1.005 - 1.030   pH 6.0 5.0 - 8.0   Glucose, UA NEGATIVE NEGATIVE mg/dL   Hgb urine dipstick LARGE (A) NEGATIVE   Bilirubin Urine NEGATIVE NEGATIVE   Ketones, ur NEGATIVE NEGATIVE mg/dL   Protein, ur NEGATIVE NEGATIVE mg/dL   Nitrite NEGATIVE NEGATIVE   Leukocytes,Ua SMALL (A) NEGATIVE   RBC / HPF 0-5 0 - 5 RBC/hpf   WBC, UA 0-5 0 - 5 WBC/hpf   Bacteria, UA RARE (A) NONE SEEN   Squamous Epithelial / LPF 0-5 0 - 5   US OB LESS THAN 14 WEEKS WITH OB TRANSVAGINAL  Result Date: 05/02/2020 CLINICAL DATA:  Low back pain EXAM: OBSTETRIC <14 WK [place] AND TRANSVAGINAL OB US TECHNIQUE: Both transabdominal and transvaginal ultrasound examinations were performed for complete evaluation of the gestation as well as the maternal uterus, adnexal regions, and pelvic cul-de-sac. Transvaginal technique was performed to assess early pregnancy. COMPARISON:  None. FINDINGS: Intrauterine gestational sac: Single Yolk sac:  Not Visualized. Embryo:  Not Visualized. Cardiac Activity: Not Visualized. MSD: 17.9 mm   6 w   4 d Subchorionic hemorrhage:  None visualized. Maternal uterus/adnexae: Right ovary measures 2.8 x 1.5 x 2.1 cm and appears unremarkable. Left ovary measures 3.9 x 2.7 x 3.4 cm and contains a probable corpus luteal cyst. No free fluid within the pelvis. IMPRESSION: Probable intrauterine gestational sac with mean sac diameter of 17.9 mm. No yolk sac or embryo are visualized at this time. Findings are suspicious but not yet definitive for failed pregnancy. Recommend follow-up US in 10-14 days for definitive diagnosis. This recommendation follows SRU consensus guidelines: Diagnostic Criteria for Nonviable Pregnancy  Early in the First Trimester. Malva Limes Med 2013; 419:3790-24. Electronically Signed   By: Duanne Guess D.O.   On: 05/02/2020 15:57     MAU Course  Procedures  MDM 1647 DW Dr. Shawnie Pons, hcg 10K, but nothing in gestational sac. Likely blighted ovum and will have patient return on 05/04/2020 in the clinic for HCG because still unable  to determine if this is an IUP with the lack of yolk sac or fetal pole seen on [place] today    Assessment and Plan   1. [redacted] weeks gestation of pregnancy   2. Vaginal bleeding in pregnancy, first trimester   3. Pregnancy of unknown anatomic location    DC home in stable condition  1st Trimester precautions  Bleeding precautions Ectopic precautions RX: none  Return to MAU as needed FU with OB as planned   Follow-up Information    [facility] for [facility] Follow up.   Specialty: Obstetrics and Gynecology Why: Monday at 8:30 for repeat blood work  Contact information: [address] [place] [address] [phone]             Thressa Sheller DNP, CNM  05/02/20  5:03 PM

## 2020-05-02 NOTE — MAU Note (Signed)
Started bleeding yesterday, heavier today- requires a pad.  Having some pain in lower back. Started prenatal care this week, has paperwork from [facility] with her.

## 2020-05-02 NOTE — MAU Note (Signed)
Unable to obtain FHR with doppler

## 2020-05-02 NOTE — MAU Note (Signed)
Hospital interpreter present per pt request

## 2020-05-04 ENCOUNTER — Ambulatory Visit: Payer: Self-pay | Admitting: *Deleted

## 2020-05-04 ENCOUNTER — Other Ambulatory Visit: Payer: Self-pay

## 2020-05-04 ENCOUNTER — Encounter: Payer: Self-pay | Admitting: *Deleted

## 2020-05-04 VITALS — BP 121/71 | HR 65 | Ht 65.0 in | Wt 156.5 lb

## 2020-05-04 DIAGNOSIS — O3680X0 Pregnancy with inconclusive fetal viability, not applicable or unspecified: Secondary | ICD-10-CM

## 2020-05-04 DIAGNOSIS — O039 Complete or unspecified spontaneous abortion without complication: Secondary | ICD-10-CM

## 2020-05-04 LAB — BETA HCG QUANT (REF LAB): hCG Quant: 6309 m[IU]/mL

## 2020-05-04 NOTE — Progress Notes (Signed)
Interpreter [employer] present for encounter. Pt reports vaginal bleeding which is the same as during her hospital visit on 3/12. She also has back pain. Stat BHCG drawn and pt was advised she will be called with results later today. Pt was advised to return to MAU if she develops heavier vaginal bleeding or increased pain. She voiced understanding and states that a message can be left on her voicemail if she does not answer.   1305  BHCG result reviewed by Dr. Donavan Foil who finds decrease in level consistent with a failed pregnancy. Pt will need weekly BHCG until level is <5.   1700  Pt was called w/[employer] [phone]. She was informed of decrease in hormone level indicating she is having a miscarriage. She will need weekly hormone level done until it has returned to the non-pregnant level. Pt voiced understanding and agreed to lab appt on 3/21 @ 10:30 am.

## 2020-05-11 ENCOUNTER — Other Ambulatory Visit: Payer: Self-pay

## 2020-05-11 DIAGNOSIS — O039 Complete or unspecified spontaneous abortion without complication: Secondary | ICD-10-CM

## 2020-05-12 LAB — BETA HCG QUANT (REF LAB): hCG Quant: 3641 m[IU]/mL

## 2020-05-13 ENCOUNTER — Telehealth: Payer: Self-pay | Admitting: Lactation Services

## 2020-05-13 NOTE — Telephone Encounter (Signed)
Called patient with assistance of [employer] [place], [place] # [phone].   Spoke with patient to inform her that her Hcg levels are decreasing as expected. Reviewed it is recommended that she return next week to have a non Stat Beta. She prefers early morning times, Monday is best.   Message to front office to call and schedule for follow up appt.

## 2020-05-13 NOTE — Telephone Encounter (Signed)
-----   Message from Warden Fillers, MD sent at 05/13/2020  1:14 PM EDT ----- Bhcg continues to decrease repeat in 1-2 weeks

## 2020-05-25 ENCOUNTER — Other Ambulatory Visit: Payer: Self-pay | Admitting: General Practice

## 2020-05-25 ENCOUNTER — Other Ambulatory Visit: Payer: Self-pay

## 2020-05-25 DIAGNOSIS — O039 Complete or unspecified spontaneous abortion without complication: Secondary | ICD-10-CM

## 2020-05-26 ENCOUNTER — Telehealth: Payer: Self-pay | Admitting: *Deleted

## 2020-05-26 LAB — BETA HCG QUANT (REF LAB): hCG Quant: 7 m[IU]/mL

## 2020-05-26 NOTE — Telephone Encounter (Signed)
-----   Message from Warden Fillers, MD sent at 05/26/2020 12:25 PM EDT ----- Gwen Her almost normal, would repeat in 2-3 weeks

## 2020-05-26 NOTE — Telephone Encounter (Signed)
I called [name] with [employer] [phone] and informed her or results and recommendation. She voices understanding and agreed to lab only appointment 06/09/20 1:30pm.  Nicole Lang

## 2020-06-09 ENCOUNTER — Other Ambulatory Visit: Payer: Self-pay

## 2020-06-09 DIAGNOSIS — O039 Complete or unspecified spontaneous abortion without complication: Secondary | ICD-10-CM

## 2020-06-10 LAB — BETA HCG QUANT (REF LAB): hCG Quant: <1 m[IU]/mL

## 2020-06-12 ENCOUNTER — Telehealth: Payer: Self-pay

## 2020-06-12 NOTE — Telephone Encounter (Signed)
-----   Message from Warden Fillers, MD sent at 06/12/2020  8:20 AM EDT ----- Bhcg levels now normal, no further blood draws are needed

## 2020-06-12 NOTE — Telephone Encounter (Signed)
Spoke to patient and informed on lab results. Patient understood.  [employer] interpreter Ariel ID# [phone] assist with the call.  Alesia Banda Y/ [facility] 06/12/2020

## 2022-09-29 IMAGING — US US OB < 14 WEEKS - US OB TV
1 series · 15 of 28 positions shown · non-contrast
Comparison: None.

CLINICAL DATA: Low back pain

EXAM:
OBSTETRIC <14 WK US AND TRANSVAGINAL OB US
TECHNIQUE: Both transabdominal and transvaginal ultrasound examinations were
performed for complete evaluation of the gestation as well as the
maternal uterus, adnexal regions, and pelvic cul-de-sac.
Transvaginal technique was performed to assess early pregnancy.

[Series 1: us ob < 14 weeks - us ob tv · 56 acquisitions, 15 frames shown]
[im 1/56]
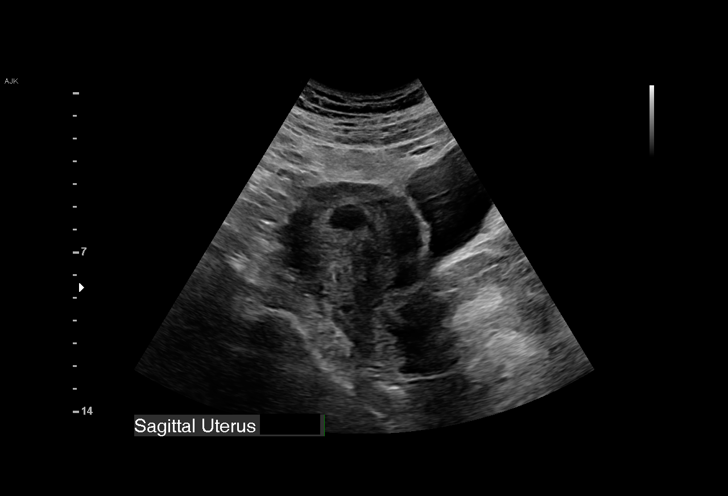
[im 5/56]
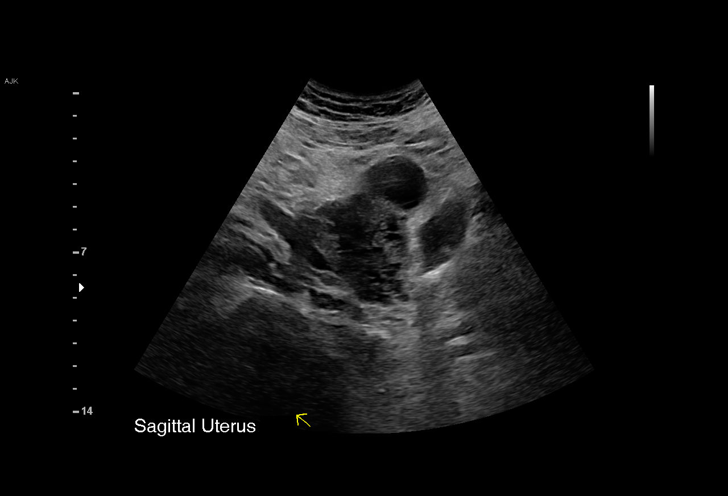
[im 9/56]
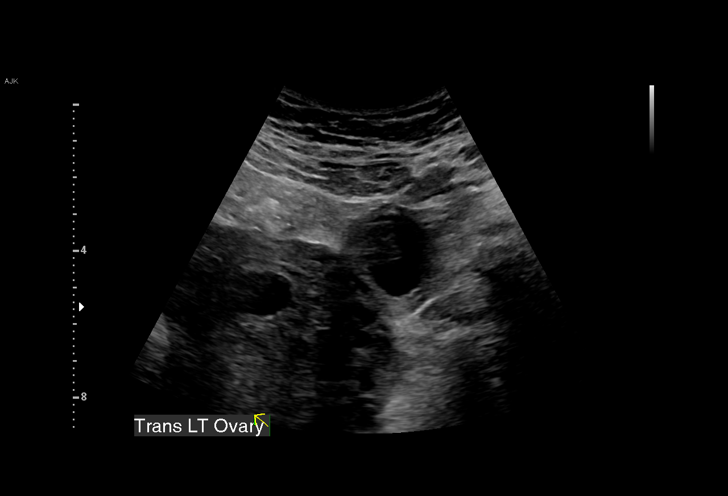
[im 13/56]
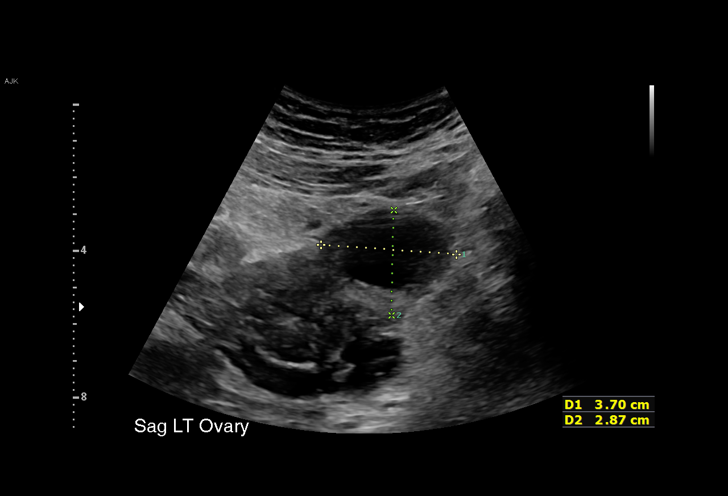
[im 17/56]
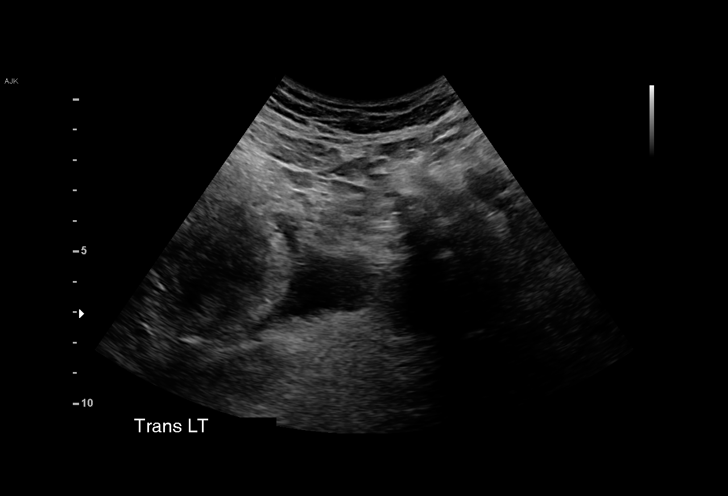
[im 21/56]
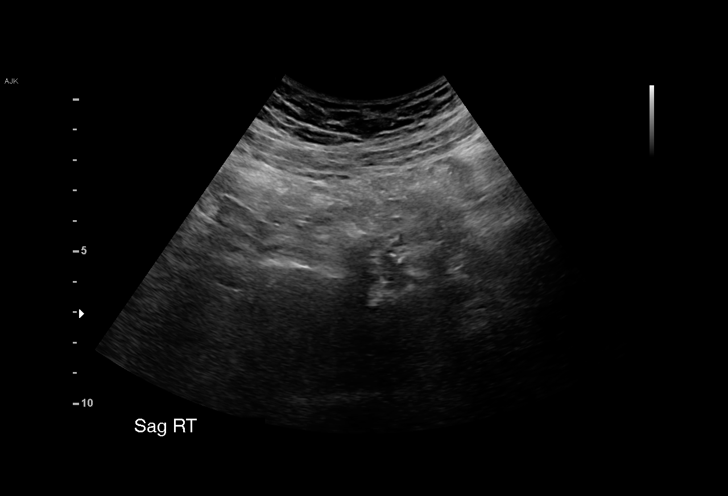
[im 25/56]
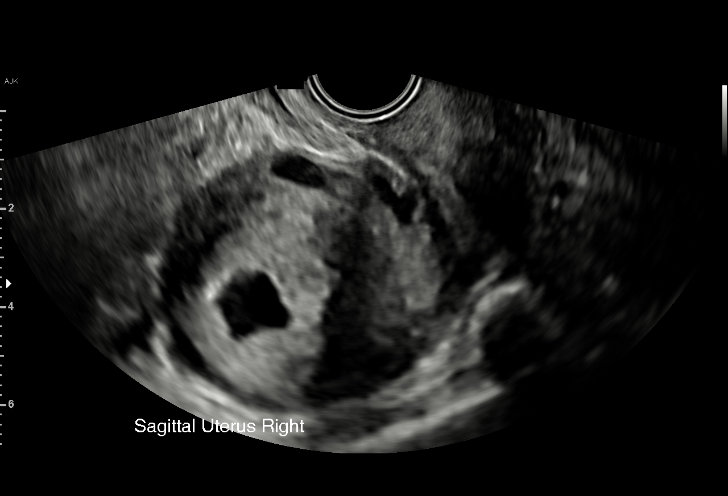
[im 29/56]
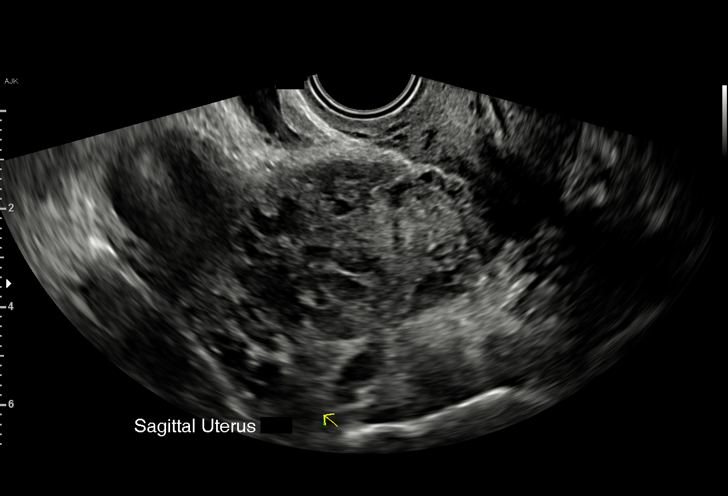
[im 31/56]
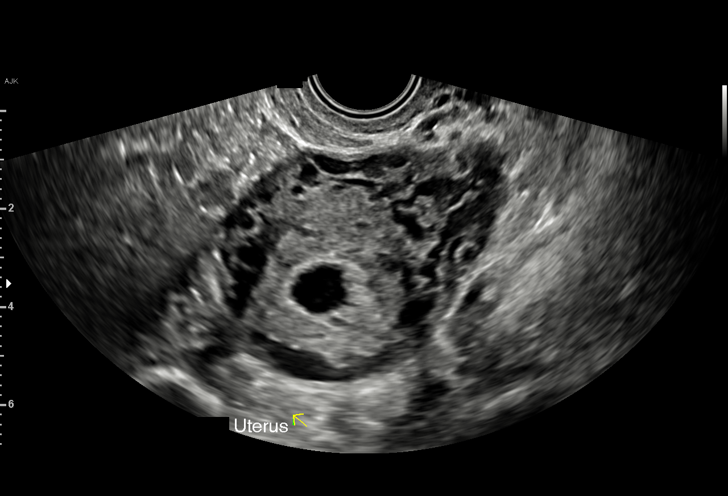
[im 35/56]
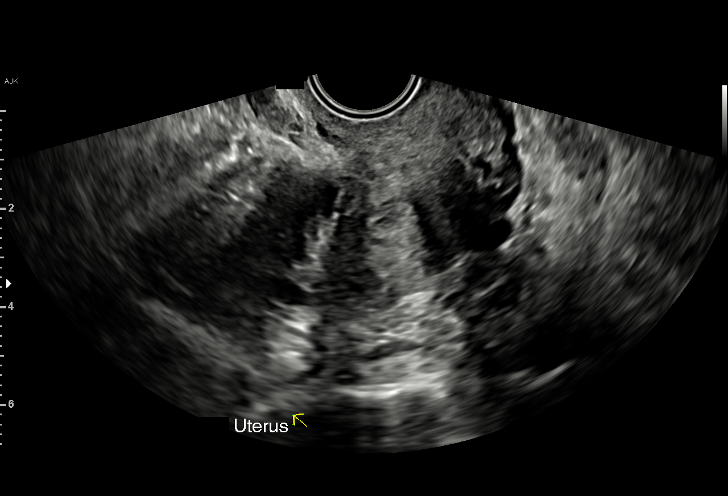
[im 39/56]
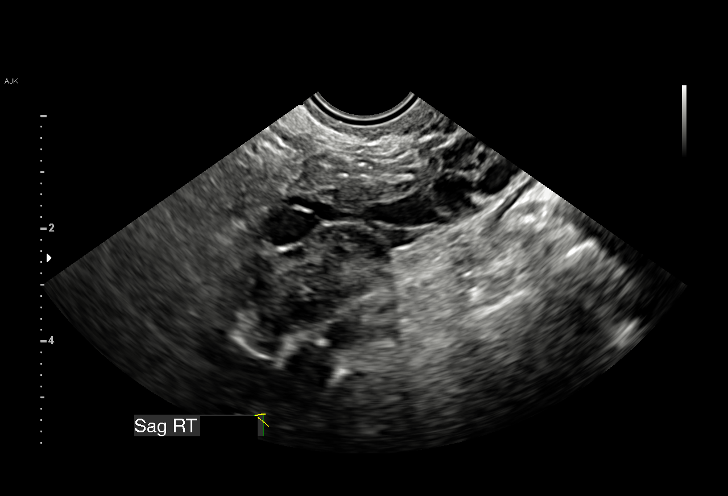
[im 43/56]
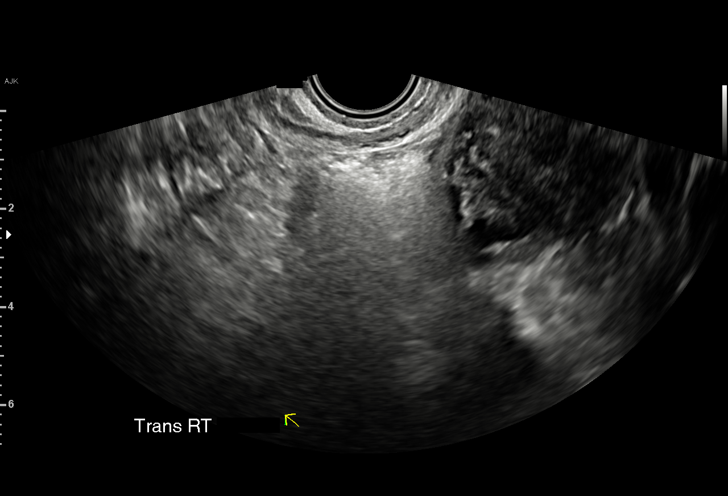
[im 47/56]
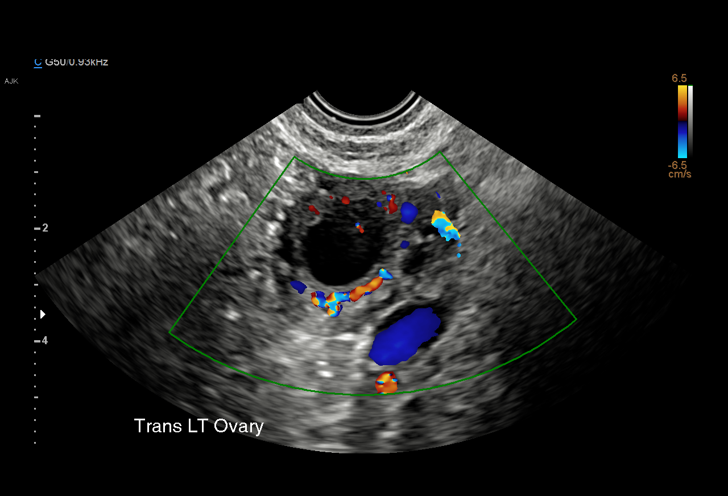
[im 51/56]
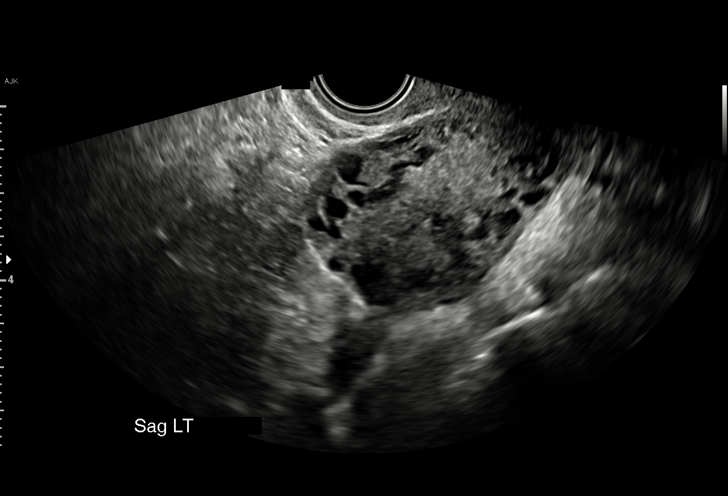
[im 56/56]
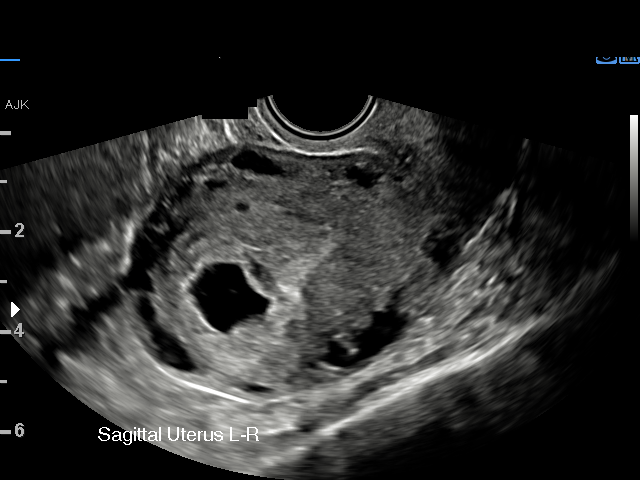

[15 of 28 positions shown; findings below may reference images not displayed]

FINDINGS: Intrauterine gestational sac: Single

Yolk sac:  Not Visualized.

Embryo:  Not Visualized.

Cardiac Activity: Not Visualized.

MSD: 17.9 mm   6 w   4 d

Subchorionic hemorrhage:  None visualized.

Maternal uterus/adnexae: Right ovary measures 2.8 x 1.5 x 2.1 cm and
appears unremarkable. Left ovary measures 3.9 x 2.7 x 3.4 cm and
contains a probable corpus luteal cyst. No free fluid within the
pelvis.
IMPRESSION: Probable intrauterine gestational sac with mean sac diameter of
mm. No yolk sac or embryo are visualized at this time. Findings are
suspicious but not yet definitive for failed pregnancy. Recommend
follow-up US in 10-14 days for definitive diagnosis. This
recommendation follows SRU consensus guidelines: Diagnostic Criteria
for Nonviable Pregnancy Early in the First Trimester. N Engl J Med

## 2023-03-23 ENCOUNTER — Inpatient Hospital Stay (HOSPITAL_COMMUNITY)
Admission: AD | Admit: 2023-03-23 | Discharge: 2023-03-23 | Disposition: A | Discharge status: Home / Self Care | Payer: Self-pay | Attending: Obstetrics and Gynecology | Admitting: Obstetrics and Gynecology

## 2024-03-19 ENCOUNTER — Ambulatory Visit
Admission: EM | Admit: 2024-03-19 | Discharge: 2024-03-19 | Disposition: A | Discharge status: Home / Self Care | Payer: Self-pay | Attending: Physician Assistant | Admitting: Physician Assistant

## 2024-03-19 ENCOUNTER — Encounter: Payer: Self-pay | Admitting: Emergency Medicine

## 2024-03-19 ENCOUNTER — Other Ambulatory Visit: Payer: Self-pay

## 2024-03-19 DIAGNOSIS — B3731 Acute candidiasis of vulva and vagina: Secondary | ICD-10-CM | POA: Insufficient documentation

## 2024-03-19 MED ORDER — FLUCONAZOLE 150 MG PO TABS: 150.0000 mg | ORAL_TABLET | Freq: Every day | ORAL | 1 refills | Status: AC

## 2024-03-19 NOTE — Discharge Instructions (Addendum)
 Return if any problems.

## 2024-03-19 NOTE — ED Provider Notes (Signed)
 " [facility]    CSN: [id number] Arrival date & time: 03/19/24  1622      History   Chief Complaint Chief Complaint  Patient presents with   Vaginal Itching    HPI Nicole Lang is a 34 y.o. female.   Patient complains of vaginal itching and a rash on her legs.  Patient reports that she has had a yeast infection in the past.  Patient states that this feels the same.  Patient denies any fever or chills she has not had any nausea or vomiting she denies any STD risk  The history is provided by the patient. A language interpreter was used.  Vaginal Itching    Past Medical History:  Diagnosis Date   Medical history non-contributory     Patient Active Problem List   Diagnosis Date Noted   NSVD (normal spontaneous vaginal delivery) 08/11/2015   Active labor 08/10/2015    Past Surgical History:  Procedure Laterality Date   NO PAST SURGERIES      OB History     Gravida  3   Para  2   Term  2   Preterm      AB      Living  2      SAB      IAB      Ectopic      Multiple  0   Live Births  2            Home Medications    Prior to Admission medications  Medication Sig Start Date End Date Taking? Authorizing Provider  fluconazole  (DIFLUCAN ) 150 MG tablet Take 1 tablet (150 mg total) by mouth daily. 03/19/24  Yes Nicole Yepez K, PA-C  acetaminophen  (TYLENOL ) 325 MG tablet Take 2 tablets (650 mg total) by mouth every 4 (four) hours as needed (for pain scale < 4). Patient not taking: Reported on 05/04/2020 08/11/15   Gonfa, Taye T, MD  ibuprofen  (ADVIL ,MOTRIN ) 600 MG tablet Take 1 tablet (600 mg total) by mouth every 6 (six) hours. Patient not taking: Reported on 05/04/2020 08/11/15   Gonfa, Taye T, MD  Prenatal Vit-Fe Fumarate-FA (PRENATAL MULTIVITAMIN) TABS tablet Take 1 tablet by mouth daily at 12 noon.    [provider]    Family History Family History  Problem Relation Age of Onset   Depression Maternal Grandmother      Social History Social History[1]   Allergies   Patient has no known allergies.   Review of Systems Review of Systems  All other systems reviewed and are negative.    Physical Exam Triage Vital Signs ED Triage Vitals  Encounter Vitals Group     BP 03/19/24 1745 (!) 148/82     Girls Systolic BP Percentile --      Girls Diastolic BP Percentile --      Boys Systolic BP Percentile --      Boys Diastolic BP Percentile --      Pulse Rate 03/19/24 1745 74     Resp 03/19/24 1745 18     Temp 03/19/24 1745 98.7 F (37.1 C)     Temp Source 03/19/24 1745 Oral     SpO2 03/19/24 1745 99 %     Weight --      Height --      Head Circumference --      Peak Flow --      Pain Score 03/19/24 1746 0     Pain Loc --  Pain Education --      Exclude from Growth Chart --    No data found.  Updated Vital Signs BP (!) 148/82 (BP Location: Left Arm)   Pulse 74   Temp 98.7 F (37.1 C) (Oral)   Resp 18   LMP 02/14/2024   SpO2 99%   Visual Acuity Right Eye Distance:   Left Eye Distance:   Bilateral Distance:    Right Eye Near:   Left Eye Near:    Bilateral Near:     Physical Exam Vitals and nursing note reviewed.  Constitutional:      Appearance: She is well-developed.  HENT:     Head: Normocephalic.  Cardiovascular:     Rate and Rhythm: Normal rate.  Pulmonary:     Effort: Pulmonary effort is normal.  Abdominal:     General: There is no distension.  Genitourinary:    Vagina: Vaginal discharge present.  Musculoskeletal:        General: Normal range of motion.     Cervical back: Normal range of motion.  Skin:    General: Skin is warm.  Neurological:     General: No focal deficit present.     Mental Status: She is alert and oriented to person, place, and time.      UC Treatments / Results  Labs (all labs ordered are listed, but only abnormal results are displayed) Labs Reviewed  CERVICOVAGINAL ANCILLARY ONLY    EKG   Radiology No results  found.  Procedures Procedures (including critical care time)  Medications Ordered in UC Medications - No data to display  Initial Impression / Assessment and Plan / UC Course  I have reviewed the triage vital signs and the nursing notes.  Pertinent labs & imaging results that were available during my care of the patient were reviewed by me and considered in my medical decision making (see chart for details).     Swab for GC chlamydia trichomonas bacterial vaginitis and yeast ordered I will treat patient for yeast infection.  She is given a prescription for Diflucan  Final Clinical Impressions(s) / UC Diagnoses   Final diagnoses:  Yeast vaginitis     Discharge Instructions      Return if any problems.    ED Prescriptions     Medication Sig Dispense Auth. Provider   fluconazole  (DIFLUCAN ) 150 MG tablet Take 1 tablet (150 mg total) by mouth daily. 1 tablet Cattaleya Wien K, PA-C      PDMP not reviewed this encounter. An After Visit Summary was printed and given to the patient.        [1]  Social History Tobacco Use   Smoking status: Never   Smokeless tobacco: Current  Substance Use Topics   Alcohol use: No   Drug use: No     Flint Sonny POUR, PA-C 03/19/24 1809  "

## 2024-03-19 NOTE — ED Triage Notes (Signed)
 Pt sts vaginal itching and burning x 1 week; pt denies discharge; pt denies dysuria or concern for STI

## 2024-03-20 LAB — CERVICOVAGINAL ANCILLARY ONLY
Bacterial Vaginitis (gardnerella): POSITIVE — AB
Candida Glabrata: NEGATIVE
Candida Vaginitis: NEGATIVE
Chlamydia: NEGATIVE
Comment: NEGATIVE
Comment: NEGATIVE
Comment: NEGATIVE
Comment: NEGATIVE
Comment: NEGATIVE
Comment: NORMAL
Neisseria Gonorrhea: NEGATIVE
Trichomonas: NEGATIVE

## 2024-03-24 ENCOUNTER — Ambulatory Visit (HOSPITAL_COMMUNITY): Payer: Self-pay

## 2024-03-24 MED ORDER — METRONIDAZOLE 500 MG PO TABS: 500.0000 mg | ORAL_TABLET | Freq: Two times a day (BID) | ORAL | 0 refills | Status: AC
